# Patient Record
Sex: Female | Born: 1975 | Race: Asian | Hispanic: No | Marital: Married
Health system: Southern US, Community
[De-identification: ages and names within clinical notes are randomized; demographics above are authoritative.]

---

## 2002-02-17 ENCOUNTER — Other Ambulatory Visit: Admission: RE | Admit: 2002-02-17 | Discharge: 2002-02-17 | Payer: Self-pay | Admitting: Family Medicine

## 2005-12-24 ENCOUNTER — Other Ambulatory Visit: Admission: RE | Admit: 2005-12-24 | Discharge: 2005-12-24 | Payer: Self-pay | Admitting: Obstetrics and Gynecology

## 2006-06-07 ENCOUNTER — Ambulatory Visit (HOSPITAL_COMMUNITY): Admission: RE | Admit: 2006-06-07 | Discharge: 2006-06-07 | Payer: Self-pay | Admitting: Family Medicine

## 2006-07-07 ENCOUNTER — Inpatient Hospital Stay (HOSPITAL_COMMUNITY): Admission: AD | Admit: 2006-07-07 | Discharge: 2006-07-09 | Payer: Self-pay | Admitting: Gynecology

## 2006-07-07 ENCOUNTER — Ambulatory Visit: Payer: Self-pay | Admitting: Obstetrics and Gynecology

## 2009-06-13 ENCOUNTER — Ambulatory Visit (HOSPITAL_COMMUNITY): Admission: RE | Admit: 2009-06-13 | Discharge: 2009-06-13 | Payer: Self-pay | Admitting: Family Medicine

## 2009-09-19 ENCOUNTER — Ambulatory Visit: Payer: Self-pay | Admitting: Obstetrics & Gynecology

## 2009-09-19 ENCOUNTER — Encounter: Admission: RE | Admit: 2009-09-19 | Discharge: 2009-10-19 | Payer: Self-pay | Admitting: Obstetrics & Gynecology

## 2009-09-21 ENCOUNTER — Ambulatory Visit (HOSPITAL_COMMUNITY): Admission: RE | Admit: 2009-09-21 | Discharge: 2009-09-21 | Payer: Self-pay | Admitting: Obstetrics & Gynecology

## 2009-09-26 ENCOUNTER — Ambulatory Visit: Payer: Self-pay | Admitting: Obstetrics and Gynecology

## 2009-09-27 ENCOUNTER — Encounter: Payer: Self-pay | Admitting: Obstetrics & Gynecology

## 2009-10-03 ENCOUNTER — Encounter: Payer: Self-pay | Admitting: Obstetrics & Gynecology

## 2009-10-03 ENCOUNTER — Ambulatory Visit: Payer: Self-pay | Admitting: Obstetrics & Gynecology

## 2009-10-06 ENCOUNTER — Ambulatory Visit: Payer: Self-pay | Admitting: Obstetrics & Gynecology

## 2009-10-10 ENCOUNTER — Ambulatory Visit: Payer: Self-pay | Admitting: Obstetrics & Gynecology

## 2009-10-11 ENCOUNTER — Encounter: Payer: Self-pay | Admitting: Obstetrics & Gynecology

## 2009-10-13 ENCOUNTER — Ambulatory Visit: Payer: Self-pay | Admitting: Obstetrics & Gynecology

## 2009-10-17 ENCOUNTER — Ambulatory Visit: Payer: Self-pay | Admitting: Obstetrics & Gynecology

## 2009-10-17 ENCOUNTER — Encounter: Payer: Self-pay | Admitting: Obstetrics & Gynecology

## 2009-10-18 ENCOUNTER — Encounter: Payer: Self-pay | Admitting: Obstetrics & Gynecology

## 2009-10-19 ENCOUNTER — Ambulatory Visit (HOSPITAL_COMMUNITY): Admission: RE | Admit: 2009-10-19 | Discharge: 2009-10-19 | Payer: Self-pay | Admitting: Obstetrics & Gynecology

## 2009-10-20 ENCOUNTER — Ambulatory Visit: Payer: Self-pay | Admitting: Obstetrics & Gynecology

## 2009-10-24 ENCOUNTER — Ambulatory Visit: Payer: Self-pay | Admitting: Obstetrics & Gynecology

## 2009-10-28 ENCOUNTER — Ambulatory Visit: Payer: Self-pay | Admitting: Obstetrics & Gynecology

## 2009-10-30 ENCOUNTER — Inpatient Hospital Stay (HOSPITAL_COMMUNITY): Admission: AD | Admit: 2009-10-30 | Discharge: 2009-10-31 | Payer: Self-pay | Admitting: Obstetrics & Gynecology

## 2009-10-30 ENCOUNTER — Ambulatory Visit: Payer: Self-pay | Admitting: Obstetrics and Gynecology

## 2011-01-07 LAB — POCT URINALYSIS DIP (DEVICE)
Bilirubin Urine: NEGATIVE
Glucose, UA: NEGATIVE mg/dL
Hgb urine dipstick: NEGATIVE
Nitrite: NEGATIVE

## 2011-01-07 LAB — GLUCOSE, CAPILLARY: Glucose-Capillary: 79 mg/dL (ref 70–99)

## 2011-01-07 LAB — CBC
Hemoglobin: 12.4 g/dL (ref 12.0–15.0)
RBC: 4.61 MIL/uL (ref 3.87–5.11)

## 2011-01-22 LAB — POCT URINALYSIS DIP (DEVICE)
Bilirubin Urine: NEGATIVE
Glucose, UA: NEGATIVE mg/dL
Glucose, UA: NEGATIVE mg/dL
Hgb urine dipstick: NEGATIVE
Ketones, ur: NEGATIVE mg/dL
Specific Gravity, Urine: 1.01 (ref 1.005–1.030)
Specific Gravity, Urine: 1.01 (ref 1.005–1.030)

## 2011-01-23 LAB — POCT URINALYSIS DIP (DEVICE)
Hgb urine dipstick: NEGATIVE
Ketones, ur: >=160 mg/dL — AB
Protein, ur: 30 mg/dL — AB
Specific Gravity, Urine: 1.025 (ref 1.005–1.030)
pH: 5 (ref 5.0–8.0)
pH: 6 (ref 5.0–8.0)

## 2011-01-24 LAB — POCT URINALYSIS DIP (DEVICE)
Glucose, UA: NEGATIVE mg/dL
Hgb urine dipstick: NEGATIVE
Ketones, ur: NEGATIVE mg/dL
Specific Gravity, Urine: <=1.005 (ref 1.005–1.030)
Urobilinogen, UA: 0.2 mg/dL (ref 0.0–1.0)

## 2013-04-03 ENCOUNTER — Other Ambulatory Visit: Payer: Self-pay | Admitting: Family Medicine

## 2013-04-03 DIAGNOSIS — R102 Pelvic and perineal pain: Secondary | ICD-10-CM

## 2013-04-10 ENCOUNTER — Other Ambulatory Visit: Payer: Self-pay

## 2013-04-14 ENCOUNTER — Ambulatory Visit
Admission: RE | Admit: 2013-04-14 | Discharge: 2013-04-14 | Disposition: A | Discharge status: Home / Self Care | Payer: BC Managed Care – PPO | Source: Ambulatory Visit | Attending: Family Medicine | Admitting: Family Medicine

## 2013-04-14 DIAGNOSIS — R102 Pelvic and perineal pain: Secondary | ICD-10-CM

## 2014-05-05 ENCOUNTER — Other Ambulatory Visit: Payer: Self-pay | Admitting: Family Medicine

## 2014-05-05 ENCOUNTER — Encounter (INDEPENDENT_AMBULATORY_CARE_PROVIDER_SITE_OTHER): Payer: Self-pay

## 2014-05-05 ENCOUNTER — Ambulatory Visit
Admission: RE | Admit: 2014-05-05 | Discharge: 2014-05-05 | Disposition: A | Discharge status: Home / Self Care | Payer: BC Managed Care – PPO | Source: Ambulatory Visit | Attending: Family Medicine | Admitting: Family Medicine

## 2014-05-05 DIAGNOSIS — R52 Pain, unspecified: Secondary | ICD-10-CM

## 2015-12-02 ENCOUNTER — Ambulatory Visit (HOSPITAL_BASED_OUTPATIENT_CLINIC_OR_DEPARTMENT_OTHER): Payer: Self-pay

## 2016-03-15 IMAGING — CR DG LUMBAR SPINE COMPLETE 4+V
5 series · 5 of 5 positions shown · non-contrast
Comparison: None.

CLINICAL DATA: Back pain, no trauma

EXAM:
LUMBAR SPINE - COMPLETE 4+ VIEW

[t l-spine a.p.]
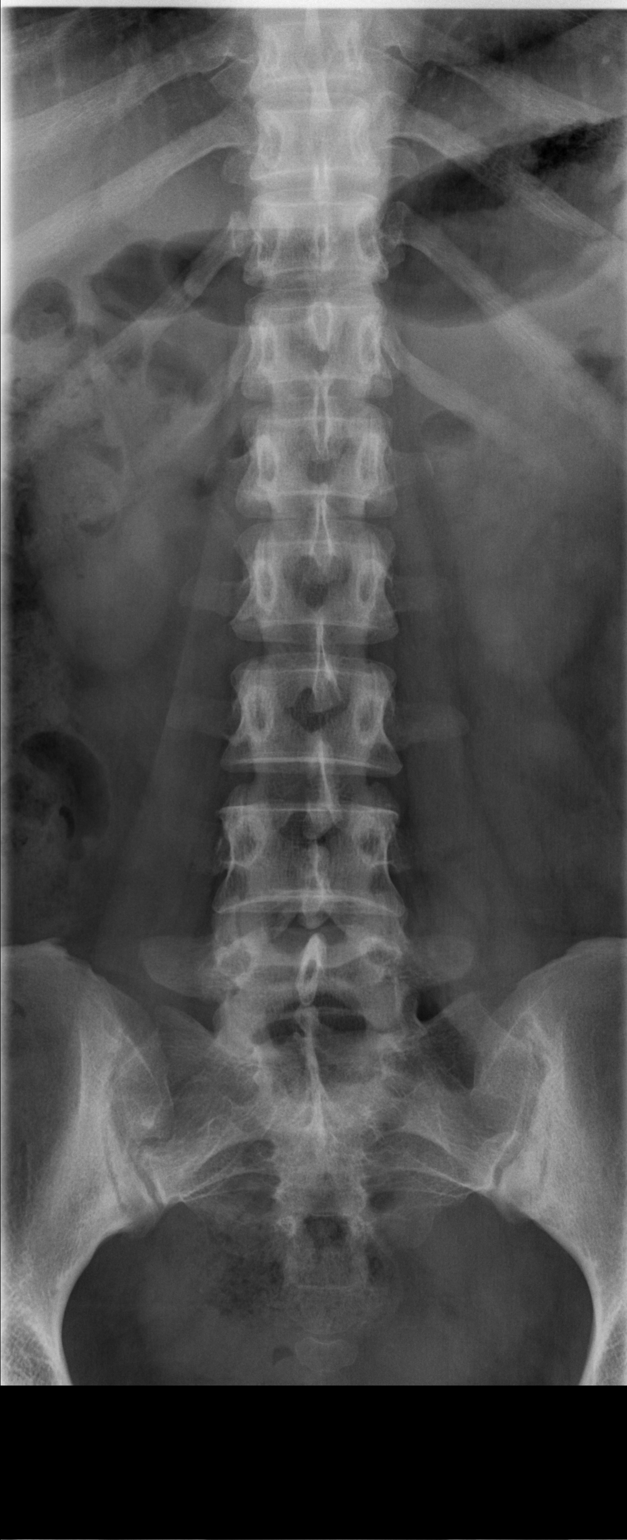

[t l-spine oblique exposure (1 of 2)]
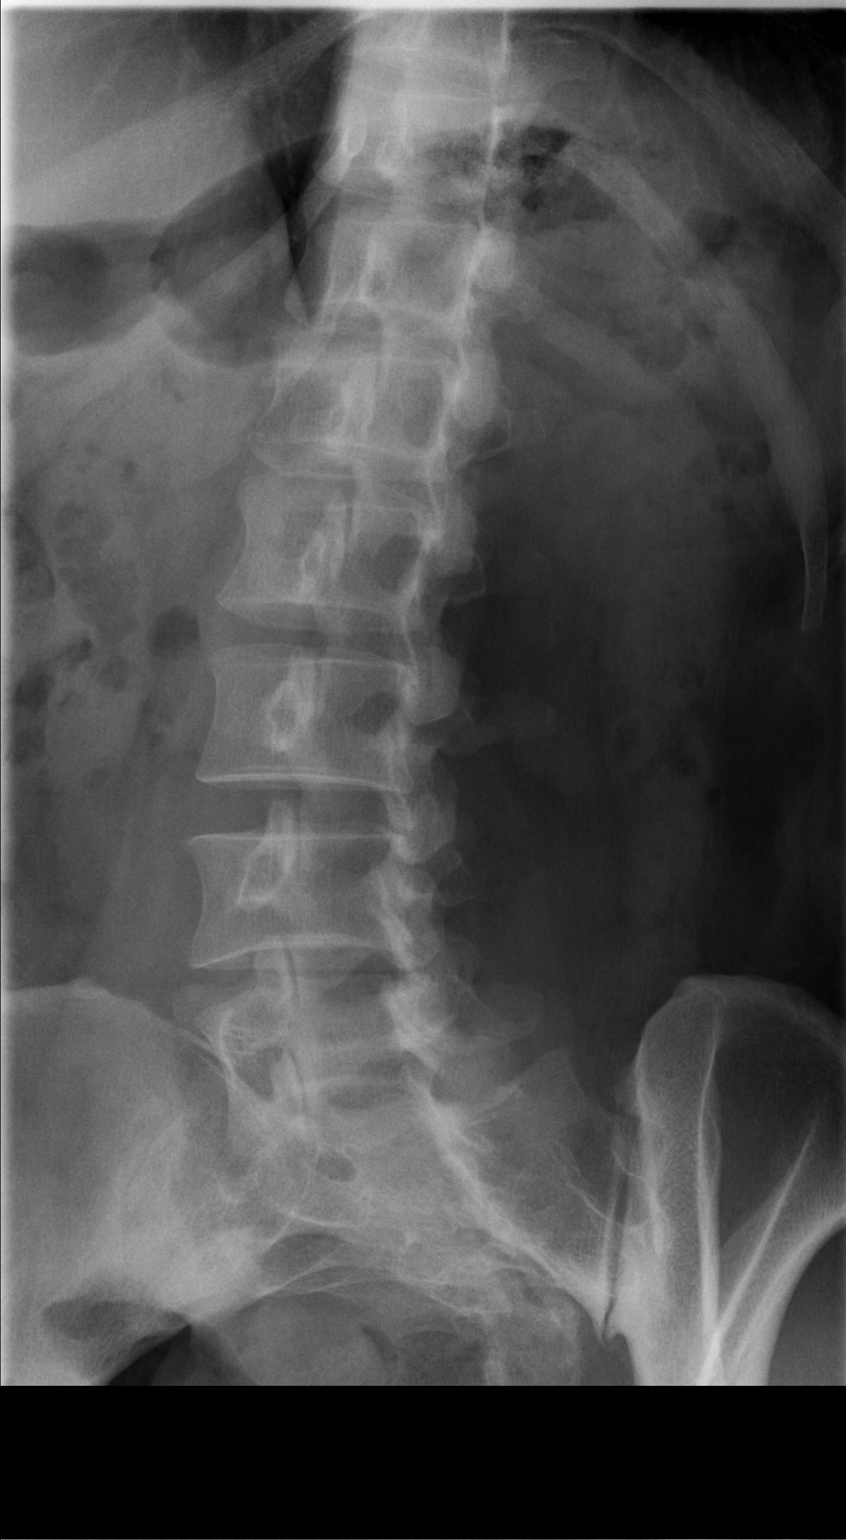

[t l-spine oblique exposure (2 of 2)]
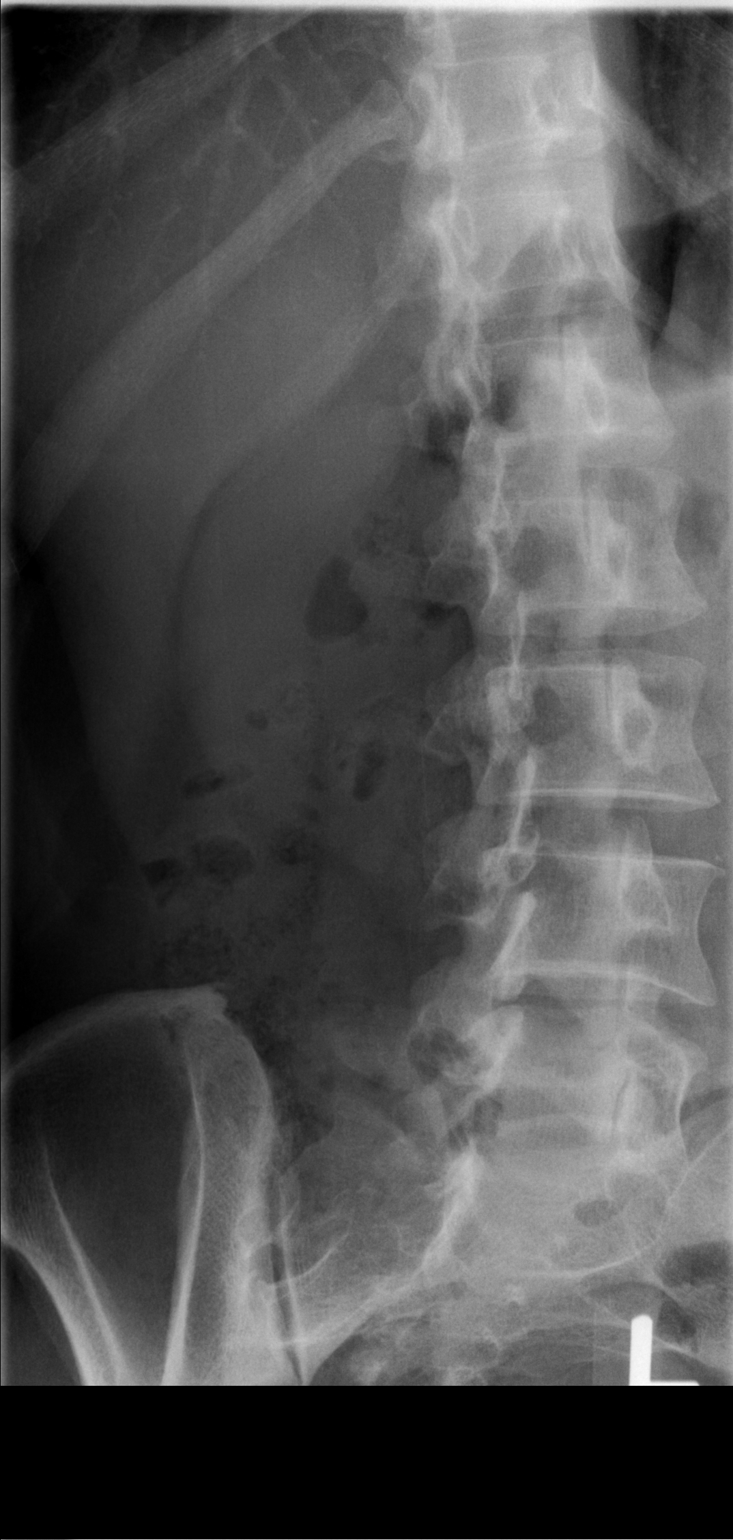

[t l-spine lat]
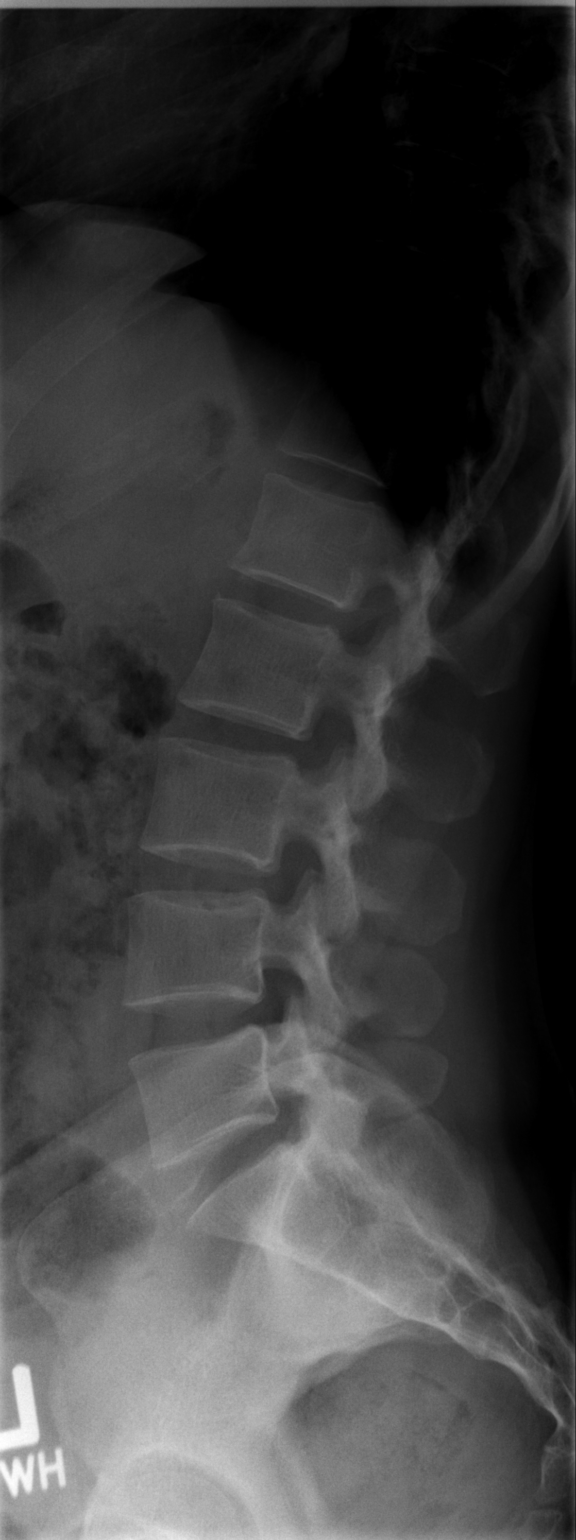

[t l-spine l5-s1 spot]
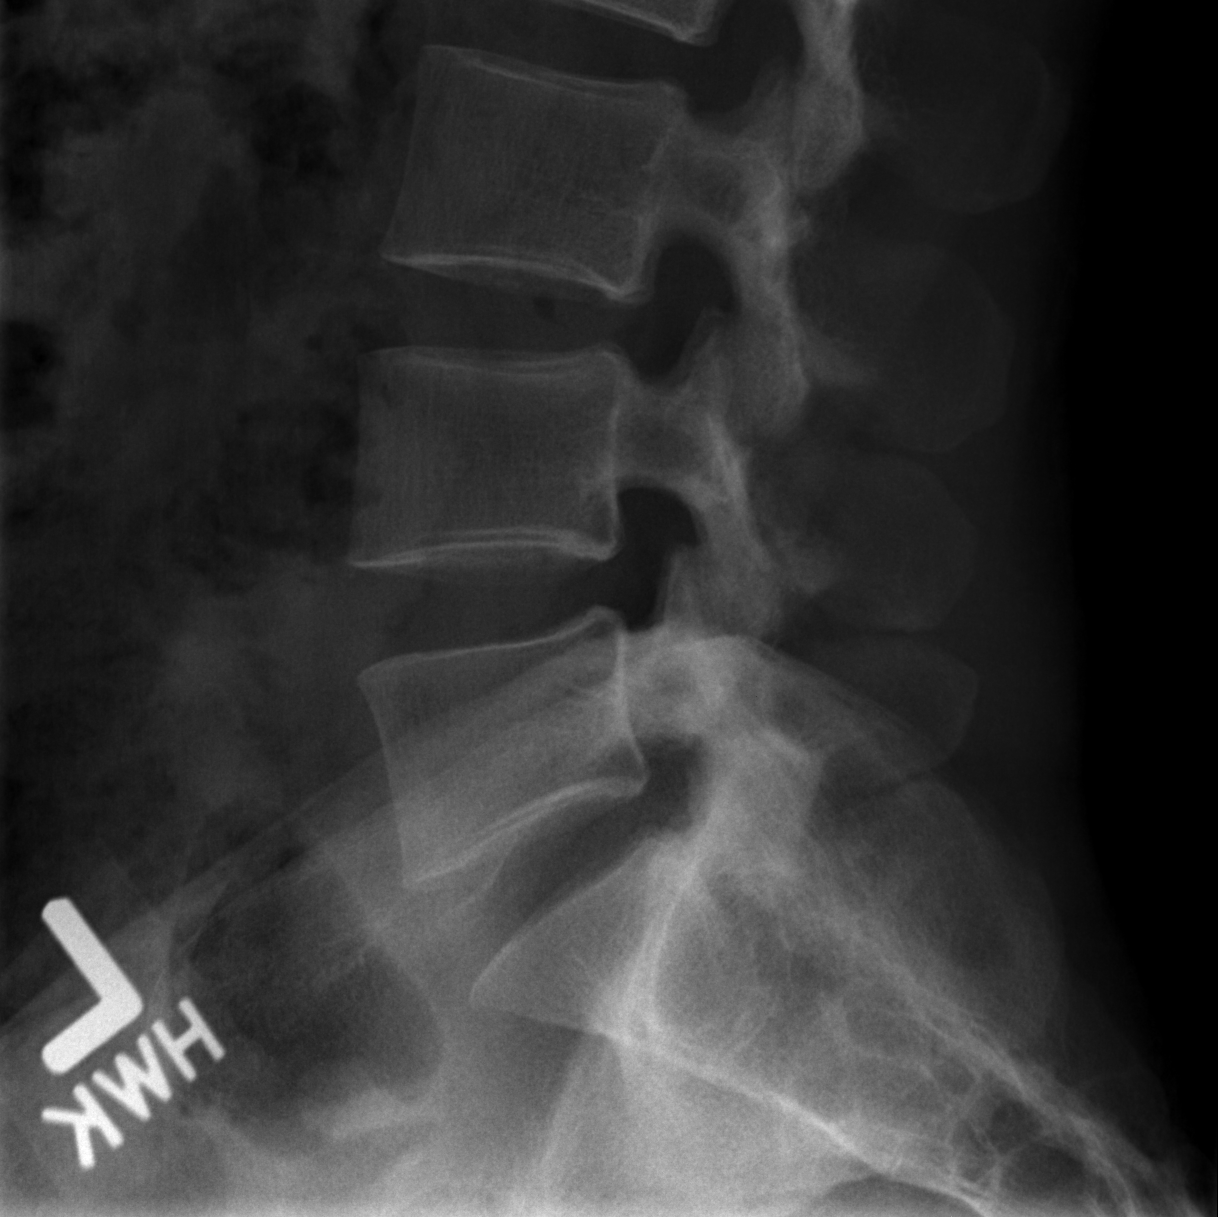

[5 of 5 positions shown; findings below may reference images not displayed]

FINDINGS: There is sclerosis of the left and right sacroiliac joint. Normal
alignment of lumbar vertebral bodies. No loss vertebral body height
or disc height. No pars fracture.
IMPRESSION: 1. Normal lumbar spine.
2. Symmetric sclerosis of the sacroiliac joints.
